# Patient Record
Sex: Female | Born: 1990 | Race: White | Hispanic: No | Smoking: Never smoker
Health system: Southern US, Community
[De-identification: ages and names within clinical notes are randomized; demographics above are authoritative.]

## PROBLEM LIST (undated history)

## (undated) HISTORY — PX: APPENDECTOMY: SHX54

---

## 2021-06-21 DIAGNOSIS — Z1388 Encounter for screening for disorder due to exposure to contaminants: Secondary | ICD-10-CM | POA: Diagnosis not present

## 2021-06-21 DIAGNOSIS — Z0389 Encounter for observation for other suspected diseases and conditions ruled out: Secondary | ICD-10-CM | POA: Diagnosis not present

## 2021-06-21 DIAGNOSIS — Z3009 Encounter for other general counseling and advice on contraception: Secondary | ICD-10-CM | POA: Diagnosis not present

## 2021-08-04 ENCOUNTER — Emergency Department (HOSPITAL_COMMUNITY)
Admission: EM | Admit: 2021-08-04 | Discharge: 2021-08-04 | Disposition: A | Discharge status: Home / Self Care | Payer: Medicaid - Out of State | Attending: Emergency Medicine | Admitting: Emergency Medicine

## 2021-08-04 ENCOUNTER — Emergency Department (HOSPITAL_COMMUNITY): Payer: Medicaid - Out of State

## 2021-08-04 ENCOUNTER — Encounter (HOSPITAL_COMMUNITY): Payer: Self-pay | Admitting: Emergency Medicine

## 2021-08-04 ENCOUNTER — Other Ambulatory Visit: Payer: Self-pay

## 2021-08-04 DIAGNOSIS — N9089 Other specified noninflammatory disorders of vulva and perineum: Secondary | ICD-10-CM

## 2021-08-04 DIAGNOSIS — M25561 Pain in right knee: Secondary | ICD-10-CM | POA: Diagnosis present

## 2021-08-04 DIAGNOSIS — N7689 Other specified inflammation of vagina and vulva: Secondary | ICD-10-CM | POA: Insufficient documentation

## 2021-08-04 DIAGNOSIS — G8929 Other chronic pain: Secondary | ICD-10-CM

## 2021-08-04 DIAGNOSIS — M25562 Pain in left knee: Secondary | ICD-10-CM | POA: Diagnosis not present

## 2021-08-04 NOTE — Discharge Instructions (Addendum)
Please follow-up with a gynecologist for second opinion of the vulvar anatomy.

## 2021-08-04 NOTE — ED Triage Notes (Signed)
Pt reports severe abdominal pain on left side and pelvic pain intermittently  4 months ago. Pt reports having swollen ovary in past. Pt also repots knee since April.

## 2021-08-04 NOTE — ED Provider Notes (Signed)
[facility]-EMERGENCY DEPT Provider Note   CSN: [id number] Arrival date & time: 08/04/21  1729     History Chief Complaint  Patient presents with   Abdominal Pain   Knee Pain   Pelvic Pain    Kylie Paul is a 30 y.o. female.  HPI    30 year old female comes in with chief complaint of knee pain and vulvar pain.  She did not mention any other discomfort to me.  Reports that she has been having some swelling to her left vulvar region for the last 4 months.  There is no itching or pain.  She has Medicaid that has very limited coverage.  She saw Medicaid nurses who did STD evaluation and put her on some antibiotics which did not help.  She denies any trauma, drainage.  She also reports having pain in her knees for the last 6 months.  Pain is present in both knees and typically provoked with her bending down and standing up.  History reviewed. No pertinent past medical history.  There are no problems to display for this patient.   OB History   No obstetric history on file.     History reviewed. No pertinent family history.     Home Medications Prior to Admission medications   Not on File    Allergies    Patient has no allergy information on record.  Review of Systems   Review of Systems  Constitutional:  Positive for activity change.  Genitourinary:  Negative for vaginal bleeding, vaginal discharge and vaginal pain.  Musculoskeletal:  Positive for arthralgias.  Allergic/Immunologic: Negative for immunocompromised state.   Physical Exam Updated Vital Signs BP 117/77   Pulse 63   Temp 98.2 F (36.8 C) (Oral)   Resp 19   LMP 06/14/2021 (Approximate)   SpO2 99%   Physical Exam Vitals and nursing note reviewed.  Constitutional:      Appearance: She is well-developed.  HENT:     Head: Atraumatic.  Cardiovascular:     Rate and Rhythm: Normal rate.  Pulmonary:     Effort: Pulmonary effort is normal.  Genitourinary:    Comments:  Left side of the vulva is more edematous compared to right, but there is no erythema, fluctuance, skin discoloration Musculoskeletal:     Cervical back: Normal range of motion and neck supple.  Skin:    General: Skin is warm and dry.  Neurological:     Mental Status: She is alert and oriented to person, place, and time.    ED Results / Procedures / Treatments   Labs (all labs ordered are listed, but only abnormal results are displayed) Labs Reviewed - No data to display  EKG None  Radiology DG Knee Complete 4 Views Left  Result Date: 08/04/2021 CLINICAL DATA:  Bilateral knee pain, no known injury, initial encounter EXAM: LEFT KNEE - COMPLETE 4+ VIEW COMPARISON:  None. FINDINGS: No evidence of fracture, dislocation, or joint effusion. No evidence of arthropathy or other focal bone abnormality. Soft tissues are unremarkable. IMPRESSION: No acute abnormality noted. Electronically Signed   By: Alcide Clever M.D.   On: 08/04/2021 19:10   DG Knee Complete 4 Views Right  Result Date: 08/04/2021 CLINICAL DATA:  Knee pain, no known injury, initial encounter EXAM: RIGHT KNEE - COMPLETE 4+ VIEW COMPARISON:  None. FINDINGS: No evidence of fracture, dislocation, or joint effusion. No evidence of arthropathy or other focal bone abnormality. Soft tissues are unremarkable. IMPRESSION: No acute abnormality noted. Electronically Signed  By: Alcide Clever M.D.   On: 08/04/2021 19:12    Procedures Procedures   Medications Ordered in ED Medications - No data to display  ED Course  I have reviewed the triage vital signs and the nursing notes.  Pertinent labs & imaging results that were available during my care of the patient were reviewed by me and considered in my medical decision making (see chart for details).    MDM Rules/Calculators/A&P                          30 year old comes in with chief complaint of volar swelling.  Although there is some asymmetry, does not appear to be abnormal on  exam.  No clear evidence of infection/abscess or even cyst.  The patient wants a second opinion, will give her gynecology follow-up information.  Bilateral knee pain that is only present with activity.  Possibly meniscal injury.  Recommended conservative management with RICE treatment.   Final Clinical Impression(s) / ED Diagnoses Final diagnoses:  Chronic pain of both knees  Edema of vulva    Rx / DC Orders ED Discharge Orders     None        Derwood Kaplan, MD 08/04/21 2059

## 2021-08-04 NOTE — ED Notes (Signed)
MD at bedside with chaperone for exam

## 2021-08-04 NOTE — ED Provider Notes (Signed)
Emergency Medicine Provider Triage Evaluation Note  Kylie Paul , a 30 y.o. female  was evaluated in triage.  Pt complains of left sided vaginal pain at the introitus with a swollen mass for the last 4 months. Denies drainage. Also endorses bilateral knee pain without inciting incident. Family hx of arthritis. Denies numbness, tingling. Was seen for vaginal mass and prescribed antibiotics without intervention or improvement.  Review of Systems  Positive: Vaginal mass, knee pain Negative: Numbness, tingling, dysuria, hematuria  Physical Exam  BP 128/76   Pulse 82   Temp 98.2 F (36.8 C) (Oral)   Resp 19   SpO2 98%  Gen:   Awake, no distress   Resp:  Normal effort  MSK:   Moves extremities without difficulty  Other:  Intact strength 5/5 bil LE, vaginal exam deferred until patient out of triage  Medical Decision Making  Medically screening exam initiated at 6:10 PM.  Appropriate orders placed.  Kylie Paul was informed that the remainder of the evaluation will be completed by another provider, this initial triage assessment does not replace that evaluation, and the importance of remaining in the ED until their evaluation is complete.  Vaginal mass, knee pain   Kylie Floss, PA-C 08/04/21 1812    Kylie Kaplan, MD 08/05/21 1410

## 2021-08-04 NOTE — ED Notes (Signed)
Pt NAD, a/ox4, c/o bilateral knee pain x months and also that she thinks her clitoris looks different. Denies dysuria, recent sexual intercourse, ABD pain, n/v/d.

## 2021-08-04 NOTE — ED Notes (Signed)
Pt NAD, a/ox4. Pt verbalizes understanding of all DC and f/u instructions. All questions answered. Pt walks with steady gait to lobby at DC.  ? ?

## 2021-08-04 NOTE — ED Notes (Signed)
Patient transported to X-ray 

## 2022-07-07 ENCOUNTER — Encounter (HOSPITAL_COMMUNITY): Payer: Self-pay

## 2022-07-07 ENCOUNTER — Emergency Department (HOSPITAL_COMMUNITY)
Admission: EM | Admit: 2022-07-07 | Discharge: 2022-07-07 | Disposition: A | Discharge status: Home / Self Care | Payer: Medicaid - Out of State | Attending: Emergency Medicine | Admitting: Emergency Medicine

## 2022-07-07 DIAGNOSIS — R42 Dizziness and giddiness: Secondary | ICD-10-CM | POA: Insufficient documentation

## 2022-07-07 LAB — I-STAT BETA HCG BLOOD, ED (MC, WL, AP ONLY): I-stat hCG, quantitative: <5 m[IU]/mL (ref <5)

## 2022-07-07 LAB — BASIC METABOLIC PANEL
Anion gap: 6 (ref 5–15)
BUN: 10 mg/dL (ref 6–20)
CO2: 27 mmol/L (ref 22–32)
Calcium: 8.9 mg/dL (ref 8.9–10.3)
Chloride: 107 mmol/L (ref 98–111)
Creatinine, Ser: 0.68 mg/dL (ref 0.44–1.00)
GFR, Estimated: >60 mL/min (ref >60)
Glucose, Bld: 88 mg/dL (ref 70–99)
Potassium: 3.5 mmol/L (ref 3.5–5.1)
Sodium: 140 mmol/L (ref 135–145)

## 2022-07-07 LAB — CBC
HCT: 38.5 % (ref 36.0–46.0)
Hemoglobin: 12.8 g/dL (ref 12.0–15.0)
MCH: 32.2 pg (ref 26.0–34.0)
MCHC: 33.2 g/dL (ref 30.0–36.0)
MCV: 96.7 fL (ref 80.0–100.0)
Platelets: 199 10*3/uL (ref 150–400)
RBC: 3.98 MIL/uL (ref 3.87–5.11)
RDW: 11.6 % (ref 11.5–15.5)
WBC: 6.5 10*3/uL (ref 4.0–10.5)
nRBC: 0 % (ref 0.0–0.2)

## 2022-07-07 LAB — URINALYSIS, ROUTINE W REFLEX MICROSCOPIC
Bilirubin Urine: NEGATIVE
Glucose, UA: NEGATIVE mg/dL
Hgb urine dipstick: NEGATIVE
Ketones, ur: NEGATIVE mg/dL
Leukocytes,Ua: NEGATIVE
Nitrite: NEGATIVE
Protein, ur: NEGATIVE mg/dL
Specific Gravity, Urine: 1.008 (ref 1.005–1.030)
pH: 6 (ref 5.0–8.0)

## 2022-07-07 LAB — CBG MONITORING, ED: Glucose-Capillary: 79 mg/dL (ref 70–99)

## 2022-07-07 MED ORDER — MECLIZINE HCL 25 MG PO TABS: 25.0000 mg | ORAL_TABLET | Freq: Three times a day (TID) | ORAL | 0 refills | Status: AC | PRN

## 2022-07-07 MED ORDER — MECLIZINE HCL 25 MG PO TABS
25.0000 mg | ORAL_TABLET | Freq: Once | ORAL | Status: AC
  Administered 2022-07-07: 25 mg via ORAL
  Filled 2022-07-07: qty 1

## 2022-07-07 NOTE — ED Triage Notes (Signed)
Pt arrived via POV, c/o headache and dizziness. States headache has subsided, but dizziness continues.

## 2022-07-07 NOTE — ED Provider Notes (Signed)
[facility] DEPT Provider Note   CSN: [id number] Arrival date & time: 07/07/22  1514     History  Chief Complaint  Patient presents with   Dizziness    Kylie Paul is a 31 y.o. female.  The history is provided by the patient. No language interpreter was used.  Dizziness Quality:  Vertigo Severity:  Moderate Onset quality:  Gradual Duration:  4 days Timing:  Constant Progression:  Unchanged Chronicity:  New Relieved by:  Nothing Worsened by:  Nothing Ineffective treatments:  None tried Associated symptoms: no nausea, no tinnitus, no vision changes and no vomiting   Risk factors: no anemia and no hx of vertigo        Home Medications Prior to Admission medications   Medication Sig Start Date End Date Taking? Authorizing Provider  meclizine (ANTIVERT) 25 MG tablet Take 1 tablet (25 mg total) by mouth 3 (three) times daily as needed for dizziness. 07/07/22  Yes Fransico Meadow, PA-C      Allergies    Patient has no known allergies.    Review of Systems   Review of Systems  HENT:  Negative for tinnitus.   Gastrointestinal:  Negative for nausea and vomiting.  Neurological:  Positive for dizziness.  All other systems reviewed and are negative.   Physical Exam Updated Vital Signs BP 135/77 (BP Location: Right Arm)   Pulse 74   Temp 98.7 F (37.1 C) (Oral)   Resp 18   Ht 5\' 5"  (1.651 m)   Wt 65.8 kg   SpO2 98%   BMI 24.13 kg/m  Physical Exam Vitals and nursing note reviewed.  Constitutional:      Appearance: She is well-developed.  HENT:     Head: Normocephalic.     Right Ear: Tympanic membrane normal.     Left Ear: Tympanic membrane normal.     Nose: Nose normal.     Mouth/Throat:     Mouth: Mucous membranes are moist.  Eyes:     Extraocular Movements: Extraocular movements intact.     Conjunctiva/sclera: Conjunctivae normal.     Pupils: Pupils are equal, round, and reactive to light.  Cardiovascular:     Rate and  Rhythm: Normal rate.  Pulmonary:     Effort: Pulmonary effort is normal.  Abdominal:     General: There is no distension.  Musculoskeletal:        General: Normal range of motion.     Cervical back: Normal range of motion.  Neurological:     Mental Status: She is alert and oriented to person, place, and time.  Psychiatric:        Mood and Affect: Mood normal.     ED Results / Procedures / Treatments   Labs (all labs ordered are listed, but only abnormal results are displayed) Labs Reviewed  URINALYSIS, ROUTINE W REFLEX MICROSCOPIC - Abnormal; Notable for the following components:      Result Value   Color, Urine STRAW (*)    All other components within normal limits  BASIC METABOLIC PANEL  CBC  CBG MONITORING, ED  I-STAT BETA HCG BLOOD, ED ([facility], [facility], [facility] ONLY)    EKG EKG Interpretation  Date/Time:  Sunday July 07 2022 16:14:40 EST Ventricular Rate:  53 PR Interval:  144 QRS Duration: 110 QT Interval:  421 QTC Calculation: 396 R Axis:   77 Text Interpretation: Sinus arrhythmia RSR' in V1 or V2, right VCD or RVH Baseline wander in lead(s) V2 No old  tracing to compare Confirmed by Linwood Dibbles [phone]) on 07/07/2022 4:21:54 PM  Radiology No results found.  Procedures Procedures    Medications Ordered in ED Medications  meclizine (ANTIVERT) tablet 25 mg (25 mg Oral Given 07/07/22 1617)    ED Course/ Medical Decision Making/ A&P                           Medical Decision Making Pt reports she has been dizzy since Thursday.  Pt reports she had a headache that resolved with medication but has contiued to have dizziness.  Amount and/or Complexity of Data Reviewed Independent Historian: parent Labs: ordered. Decision-making details documented in ED Course.    Details: Labs ordered reviewed and interpreted  ua is normal  no anemia.  ECG/medicine tests: ordered and independent interpretation performed.  Risk Prescription drug management. Risk Details: Pt has a  reassuring exam.  No neurologic findings, EKG and labs reviewed.  Pt given rx for antivert.  Pt advised primary care follow up.           Final Clinical Impression(s) / ED Diagnoses Final diagnoses:  Vertigo    Rx / DC Orders ED Discharge Orders          Ordered    meclizine (ANTIVERT) 25 MG tablet  3 times daily PRN        07/07/22 2026           An After Visit Summary was printed and given to the patient.    Osie Cheeks 07/07/22 2033    Wynetta Fines, MD 07/08/22 [phone]

## 2022-07-07 NOTE — ED Provider Triage Note (Signed)
Emergency Medicine Provider Triage Evaluation Note  Kylie Paul , a 31 y.o. female  was evaluated in triage.  Pt complains of dizzy. Report persistent dizziness x 3 days.  Mild nausea.  No fever, headache, cold sxs, cp, dysuria  Review of Systems  Positive: As above Negative: As above  Physical Exam  BP 135/77 (BP Location: Right Arm)   Pulse 74   Temp 98.7 F (37.1 C) (Oral)   Resp 18   Ht 5\' 5"  (1.651 m)   Wt 65.8 kg   SpO2 98%   BMI 24.13 kg/m  Gen:   Awake, no distress   Resp:  Normal effort  MSK:   Moves extremities without difficulty  Other:    Medical Decision Making  Medically screening exam initiated at 3:34 PM.  Appropriate orders placed.  Kylie Paul was informed that the remainder of the evaluation will be completed by another provider, this initial triage assessment does not replace that evaluation, and the importance of remaining in the ED until their evaluation is complete.     Domenic Moras, PA-C 07/07/22 1541

## 2022-07-07 NOTE — Discharge Instructions (Addendum)
Return if any problems.  Follow up with primary care for recheck.

## 2022-10-14 ENCOUNTER — Ambulatory Visit
Admission: EM | Admit: 2022-10-14 | Discharge: 2022-10-14 | Disposition: A | Discharge status: Home / Self Care | Payer: Medicaid Other | Attending: Urgent Care | Admitting: Urgent Care

## 2022-10-14 DIAGNOSIS — R110 Nausea: Secondary | ICD-10-CM

## 2022-10-14 MED ORDER — ONDANSETRON 8 MG PO TBDP: 8.0000 mg | ORAL_TABLET | Freq: Three times a day (TID) | ORAL | 0 refills | Status: AC | PRN

## 2022-10-14 NOTE — ED Triage Notes (Addendum)
Pt states she needs RTW-states she had an increase in her baseline nausea 2 days ago-states she feels better and was told by work she needed a note-NAD-steady gait

## 2022-10-14 NOTE — ED Provider Notes (Signed)
[facility]  Note:  This document was prepared using Systems analyst and may include unintentional dictation errors.  MRN: [id number] DOB: 1990/11/15  Subjective:   Kylie Paul is a 32 y.o. female presenting for ~3 year history of acute on chronic nausea without vomiting.  Patient states that she had to miss work this past weekend and is required to present a note to return to work.  No fever, throat pain, painful swallowing, abdominal pain, bloody stools, constipation, diarrhea. No fever, recent antibiotic use, hospitalizations or long distance travel.  Has not eaten raw foods, drank unfiltered water.  No history of GI disorders including Crohn's, IBS, ulcerative colitis.  Vapes multiple times daily.  Marijuana use.  Has never had an patient with a gastroenterologist.  No current facility-administered medications for this encounter.  Current Outpatient Medications:    meclizine (ANTIVERT) 25 MG tablet, Take 1 tablet (25 mg total) by mouth 3 (three) times daily as needed for dizziness., Disp: 30 tablet, Rfl: 0   No Known Allergies  History reviewed. No pertinent past medical history.   Past Surgical History:  Procedure Laterality Date   APPENDECTOMY      No family history on file.  Social History   Tobacco Use   Smoking status: Never   Smokeless tobacco: Never  Vaping Use   Vaping Use: Some days  Substance Use Topics   Alcohol use: Not Currently   Drug use: Never    ROS   Objective:   Vitals: BP 112/68 (BP Location: Right Arm)   Pulse 78   Temp 98.8 F (37.1 C) (Oral)   Resp 18   LMP 09/24/2022   SpO2 98%   Physical Exam Constitutional:      General: She is not in acute distress.    Appearance: Normal appearance. She is well-developed. She is not ill-appearing, toxic-appearing or diaphoretic.  HENT:     Head: Normocephalic and atraumatic.     Nose: Nose normal.     Mouth/Throat:     Mouth: Mucous membranes are  moist.  Eyes:     General: No scleral icterus.       Right eye: No discharge.        Left eye: No discharge.     Extraocular Movements: Extraocular movements intact.     Conjunctiva/sclera: Conjunctivae normal.  Cardiovascular:     Rate and Rhythm: Normal rate.  Pulmonary:     Effort: Pulmonary effort is normal.  Skin:    General: Skin is warm and dry.  Neurological:     General: No focal deficit present.     Mental Status: She is alert and oriented to person, place, and time.  Psychiatric:        Mood and Affect: Mood normal.        Behavior: Behavior normal.        Thought Content: Thought content normal.        Judgment: Judgment normal.     Assessment and Plan :   PDMP not reviewed this encounter.  1. Nausea without vomiting     Patient was not inclined to pursue further workup as she feels hesitant about having any kind of anesthesia for procedures such as endoscopy or just in general.  She would like medication to help her manage her nausea.  No concern for pregnancy.  Her primary goal was to obtain a note to return to work.  This is appropriate as I have low suspicion for  an infectious process given the chronic nature of her symptoms.  Use Zofran as needed.  Counseled patient on potential for adverse effects with medications prescribed/recommended today, ER and return-to-clinic precautions discussed, patient verbalized understanding.    Jaynee Eagles, PA-C 10/14/22 1426

## 2022-10-14 NOTE — Discharge Instructions (Addendum)
You may use ondansetron as needed for persistent nausea and/or vomiting. I am providing you with information to a gastroenterology practice that may be able to do more testing to evaluate your persistent nausea. Feel free to set up a consultation at your convenience.

## 2022-11-10 ENCOUNTER — Ambulatory Visit
Admission: EM | Admit: 2022-11-10 | Discharge: 2022-11-10 | Disposition: A | Discharge status: Home / Self Care | Payer: Medicaid Other | Attending: Nurse Practitioner | Admitting: Nurse Practitioner

## 2022-11-10 DIAGNOSIS — N760 Acute vaginitis: Secondary | ICD-10-CM | POA: Insufficient documentation

## 2022-11-10 MED ORDER — FLUCONAZOLE 150 MG PO TABS: 150.0000 mg | ORAL_TABLET | Freq: Every day | ORAL | 0 refills | Status: DC

## 2022-11-10 MED ORDER — METRONIDAZOLE 500 MG PO TABS: 500.0000 mg | ORAL_TABLET | Freq: Two times a day (BID) | ORAL | 0 refills | Status: DC

## 2022-11-10 NOTE — Discharge Instructions (Signed)
Diflucan as prescribed Flagyl twice daily for 7 days The clinic will contact you with results of the vaginal swab done today if positive Follow-up with your PCP or GYN if symptoms do not improve Please go to the ER for any worsening symptoms

## 2022-11-10 NOTE — ED Triage Notes (Signed)
Pt c/o vaginal itching, welling and redness that began friday.  Home interventions: none

## 2022-11-10 NOTE — ED Provider Notes (Signed)
[facility]    CSN: [id number] Arrival date & time: 11/10/22  1309      History   Chief Complaint Chief Complaint  Patient presents with   Vaginal Itching    HPI Kylie Paul is a 32 y.o. female resents for evaluation of vaginal irritation.  Patient reports 2 to 3 days of vaginal itching, irritation, swelling.  Reports some vaginal discharge but is not sure if it is more than normal.  Denies dysuria, fevers, nausea/vomiting, flank pain.  No STD exposure or concern.  Reports a history of BV and yeast and states this feels similar to both.  States over-the-counter yeast medication does not work for her.  No OTC meds have been used for symptoms.  No other concerns at this time.   Vaginal Itching    History reviewed. No pertinent past medical history.  There are no problems to display for this patient.   Past Surgical History:  Procedure Laterality Date   APPENDECTOMY      OB History   No obstetric history on file.      Home Medications    Prior to Admission medications   Medication Sig Start Date End Date Taking? Authorizing Provider  fluconazole (DIFLUCAN) 150 MG tablet Take 1 tablet (150 mg total) by mouth daily. Take 1 tablet by mouth once and may repeat in 3 days if symptoms persist 11/10/22  Yes Melynda Ripple, NP  metroNIDAZOLE (FLAGYL) 500 MG tablet Take 1 tablet (500 mg total) by mouth 2 (two) times daily. 11/10/22  Yes Melynda Ripple, NP  meclizine (ANTIVERT) 25 MG tablet Take 1 tablet (25 mg total) by mouth 3 (three) times daily as needed for dizziness. 07/07/22   Fransico Meadow, PA-C  ondansetron (ZOFRAN-ODT) 8 MG disintegrating tablet Take 1 tablet (8 mg total) by mouth every 8 (eight) hours as needed for nausea or vomiting. 10/14/22   Jaynee Eagles, PA-C    Family History History reviewed. No pertinent family history.  Social History Social History   Tobacco Use   Smoking status: Never   Smokeless tobacco: Never  Vaping Use   Vaping Use: Some  days  Substance Use Topics   Alcohol use: Not Currently   Drug use: Never     Allergies   Patient has no known allergies.   Review of Systems Review of Systems  Genitourinary:  Positive for vaginal discharge.     Physical Exam Triage Vital Signs ED Triage Vitals  Enc Vitals Group     BP 11/10/22 1349 111/60     Pulse Rate 11/10/22 1349 72     Resp 11/10/22 1349 16     Temp 11/10/22 1349 98 F (36.7 C)     Temp Source 11/10/22 1349 Oral     SpO2 11/10/22 1349 98 %     Weight --      Height --      Head Circumference --      Peak Flow --      Pain Score 11/10/22 1348 0     Pain Loc --      Pain Edu? --      Excl. in [place]? --    No data found.  Updated Vital Signs BP 111/60 (BP Location: Left Arm)   Pulse 72   Temp 98 F (36.7 C) (Oral)   Resp 16   LMP 10/31/2022 (Approximate)   SpO2 98%   Visual Acuity Right Eye Distance:   Left Eye Distance:  Bilateral Distance:    Right Eye Near:   Left Eye Near:    Bilateral Near:     Physical Exam Vitals and nursing note reviewed.  Constitutional:      Appearance: Normal appearance.  HENT:     Head: Normocephalic and atraumatic.  Eyes:     Pupils: Pupils are equal, round, and reactive to light.  Cardiovascular:     Rate and Rhythm: Normal rate.  Pulmonary:     Effort: Pulmonary effort is normal.  Abdominal:     Tenderness: There is no right CVA tenderness or left CVA tenderness.  Skin:    General: Skin is warm and dry.  Neurological:     General: No focal deficit present.     Mental Status: She is alert and oriented to person, place, and time.  Psychiatric:        Mood and Affect: Mood normal.        Behavior: Behavior normal.      UC Treatments / Results  Labs (all labs ordered are listed, but only abnormal results are displayed) Labs Reviewed  CERVICOVAGINAL ANCILLARY ONLY    EKG   Radiology No results found.  Procedures Procedures (including critical care time)  Medications Ordered  in UC Medications - No data to display  Initial Impression / Assessment and Plan / UC Course  I have reviewed the triage vital signs and the nursing notes.  Pertinent labs & imaging results that were available during my care of the patient were reviewed by me and considered in my medical decision making (see chart for details).     Start Diflucan and Flagyl as prescribed Will contact patient results of vaginal swab if positive Follow-up with PCP or GYN if symptoms do not improve ER precautions reviewed and patient verbalized understanding Final Clinical Impressions(s) / UC Diagnoses   Final diagnoses:  Acute vaginitis     Discharge Instructions      Diflucan as prescribed Flagyl twice daily for 7 days The clinic will contact you with results of the vaginal swab done today if positive Follow-up with your PCP or GYN if symptoms do not improve Please go to the ER for any worsening symptoms   ED Prescriptions     Medication Sig Dispense Auth. Provider   metroNIDAZOLE (FLAGYL) 500 MG tablet Take 1 tablet (500 mg total) by mouth 2 (two) times daily. 14 tablet Melynda Ripple, NP   fluconazole (DIFLUCAN) 150 MG tablet Take 1 tablet (150 mg total) by mouth daily. Take 1 tablet by mouth once and may repeat in 3 days if symptoms persist 2 tablet Melynda Ripple, NP      PDMP not reviewed this encounter.   Melynda Ripple, NP 11/10/22 [phone]

## 2022-11-11 LAB — CERVICOVAGINAL ANCILLARY ONLY
Bacterial Vaginitis (gardnerella): NEGATIVE
Candida Glabrata: NEGATIVE
Candida Vaginitis: POSITIVE — AB
Comment: NEGATIVE
Comment: NEGATIVE
Comment: NEGATIVE

## 2022-11-17 ENCOUNTER — Ambulatory Visit
Admission: EM | Admit: 2022-11-17 | Discharge: 2022-11-17 | Disposition: A | Discharge status: Home / Self Care | Payer: Medicaid Other | Attending: Urgent Care | Admitting: Urgent Care

## 2022-11-17 DIAGNOSIS — N760 Acute vaginitis: Secondary | ICD-10-CM

## 2022-11-17 MED ORDER — FLUCONAZOLE 150 MG PO TABS: 150.0000 mg | ORAL_TABLET | ORAL | 0 refills | Status: DC

## 2022-11-17 NOTE — ED Triage Notes (Signed)
Pt c/o vaginal itching and irritation x 1 week-states she took abx last week-requesting yeast med and RTW note-NAD-steady gait

## 2022-11-17 NOTE — Discharge Instructions (Addendum)
Dosing of fluconazole.  11/17/2022 Sunday 11/20/2022 Wednesday 11/23/2022 Saturday 11/26/2022 Tuesday 11/29/2022 Friday

## 2022-11-17 NOTE — ED Provider Notes (Signed)
[facility]  Note:  This document was prepared using Systems analyst and may include unintentional dictation errors.  MRN: [id number] DOB: [birth year]  Subjective:   Kylie Paul is a 32 y.o. female presenting for recheck on persistent and worsening vaginal itching.  Patient was seen in 11/10/2022 and started empirically on metronidazole and fluconazole.  She tested negative for everything except for yeast vaginitis.  Has had difficulty with yeast infections in the past.  Requires multiple doses of fluconazole.  She did end up stopping the metronidazole after few days due to being unable to tolerate it.  No concerns for STI, does not want to be swabbed again.  No urinary symptoms.  No current facility-administered medications for this encounter.  Current Outpatient Medications:    fluconazole (DIFLUCAN) 150 MG tablet, Take 1 tablet (150 mg total) by mouth daily. Take 1 tablet by mouth once and may repeat in 3 days if symptoms persist, Disp: 2 tablet, Rfl: 0   meclizine (ANTIVERT) 25 MG tablet, Take 1 tablet (25 mg total) by mouth 3 (three) times daily as needed for dizziness., Disp: 30 tablet, Rfl: 0   metroNIDAZOLE (FLAGYL) 500 MG tablet, Take 1 tablet (500 mg total) by mouth 2 (two) times daily., Disp: 14 tablet, Rfl: 0   ondansetron (ZOFRAN-ODT) 8 MG disintegrating tablet, Take 1 tablet (8 mg total) by mouth every 8 (eight) hours as needed for nausea or vomiting., Disp: 20 tablet, Rfl: 0   No Known Allergies  History reviewed. No pertinent past medical history.   Past Surgical History:  Procedure Laterality Date   APPENDECTOMY      No family history on file.  Social History   Tobacco Use   Smoking status: Never   Smokeless tobacco: Never  Vaping Use   Vaping Use: Some days  Substance Use Topics   Alcohol use: Not Currently   Drug use: Never    ROS   Objective:   Vitals: BP (!) 116/58 (BP Location: Left Arm)   Pulse 74    Temp 98.6 F (37 C) (Oral)   Resp 16   LMP 10/31/2022 (Approximate)   SpO2 98%   Physical Exam Constitutional:      General: She is not in acute distress.    Appearance: Normal appearance. She is well-developed. She is not ill-appearing, toxic-appearing or diaphoretic.  HENT:     Head: Normocephalic and atraumatic.     Nose: Nose normal.     Mouth/Throat:     Mouth: Mucous membranes are moist.  Eyes:     General: No scleral icterus.       Right eye: No discharge.        Left eye: No discharge.     Extraocular Movements: Extraocular movements intact.  Cardiovascular:     Rate and Rhythm: Normal rate.  Pulmonary:     Effort: Pulmonary effort is normal.  Genitourinary:    Comments: Patient politely declines pelvic exam. Skin:    General: Skin is warm and dry.  Neurological:     General: No focal deficit present.     Mental Status: She is alert and oriented to person, place, and time.  Psychiatric:        Mood and Affect: Mood normal.        Behavior: Behavior normal.        Thought Content: Thought content normal.        Judgment: Judgment normal.     Assessment and Plan :  PDMP not reviewed this encounter.  1. Acute vaginitis     Will use an extended course of fluconazole given extensive history of yeast vaginitis.  Patient declined further testing and I am in agreement. Counseled patient on potential for adverse effects with medications prescribed/recommended today, ER and return-to-clinic precautions discussed, patient verbalized understanding.    Jaynee Eagles, PA-C 11/17/22 1100

## 2022-12-11 IMAGING — CR DG KNEE COMPLETE 4+V*R*
4 series · 4 of 4 positions shown · non-contrast
Comparison: None.

CLINICAL DATA: Knee pain, no known injury, initial encounter

EXAM:
RIGHT KNEE - COMPLETE 4+ VIEW

[t knee ap right]
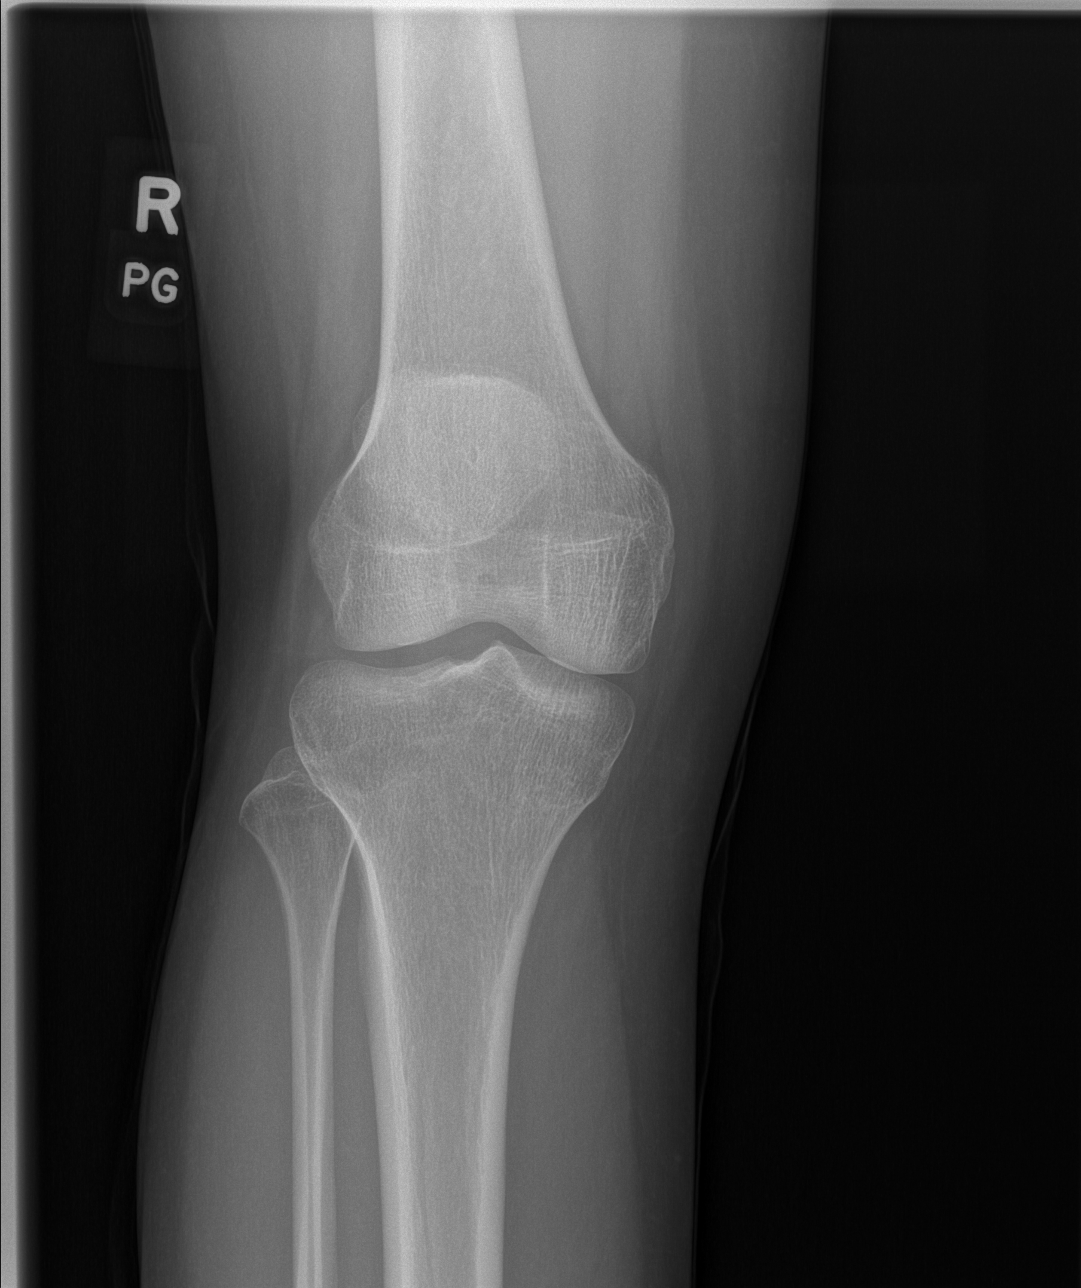

[t knee obl right (1 of 2)]
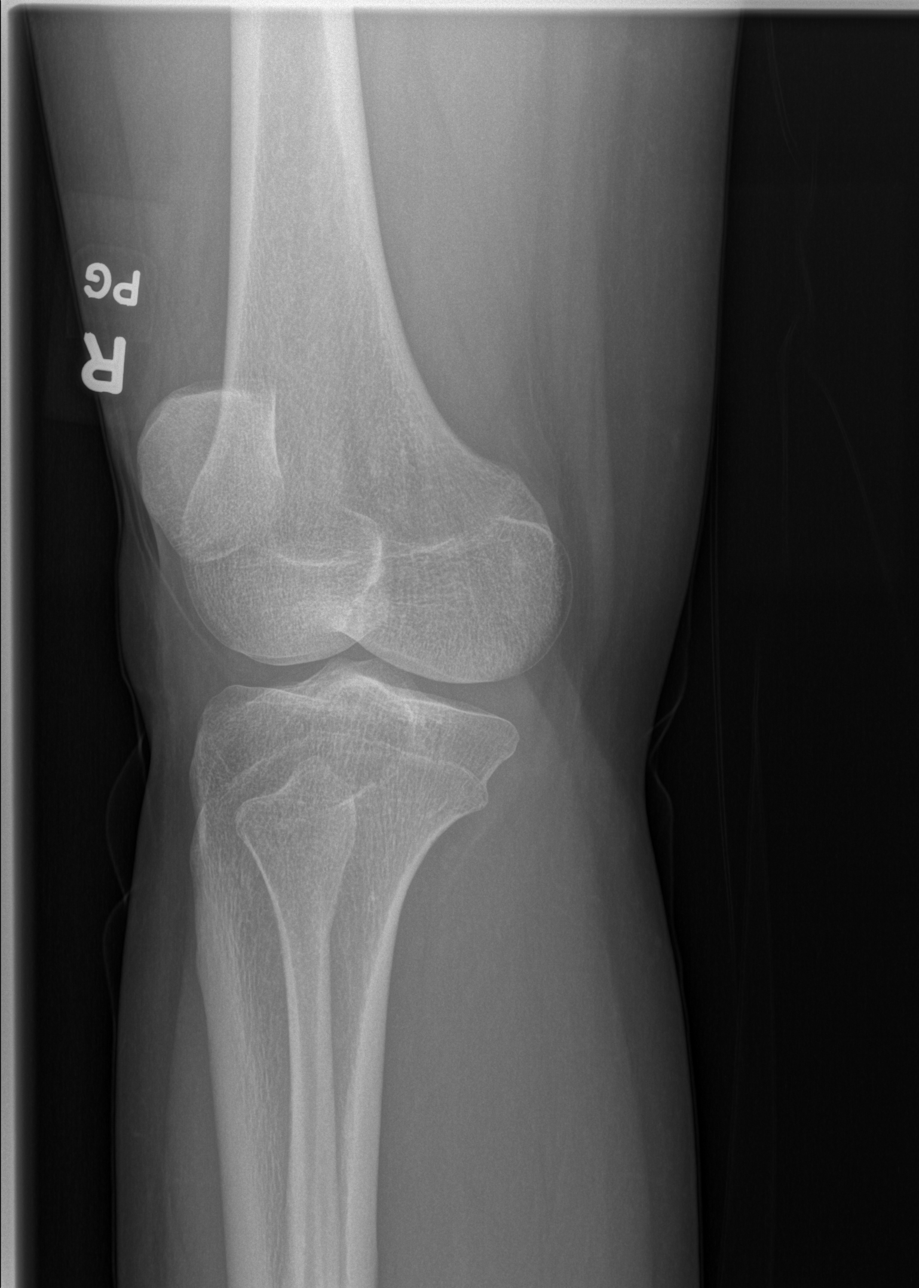

[t knee obl right (2 of 2)]
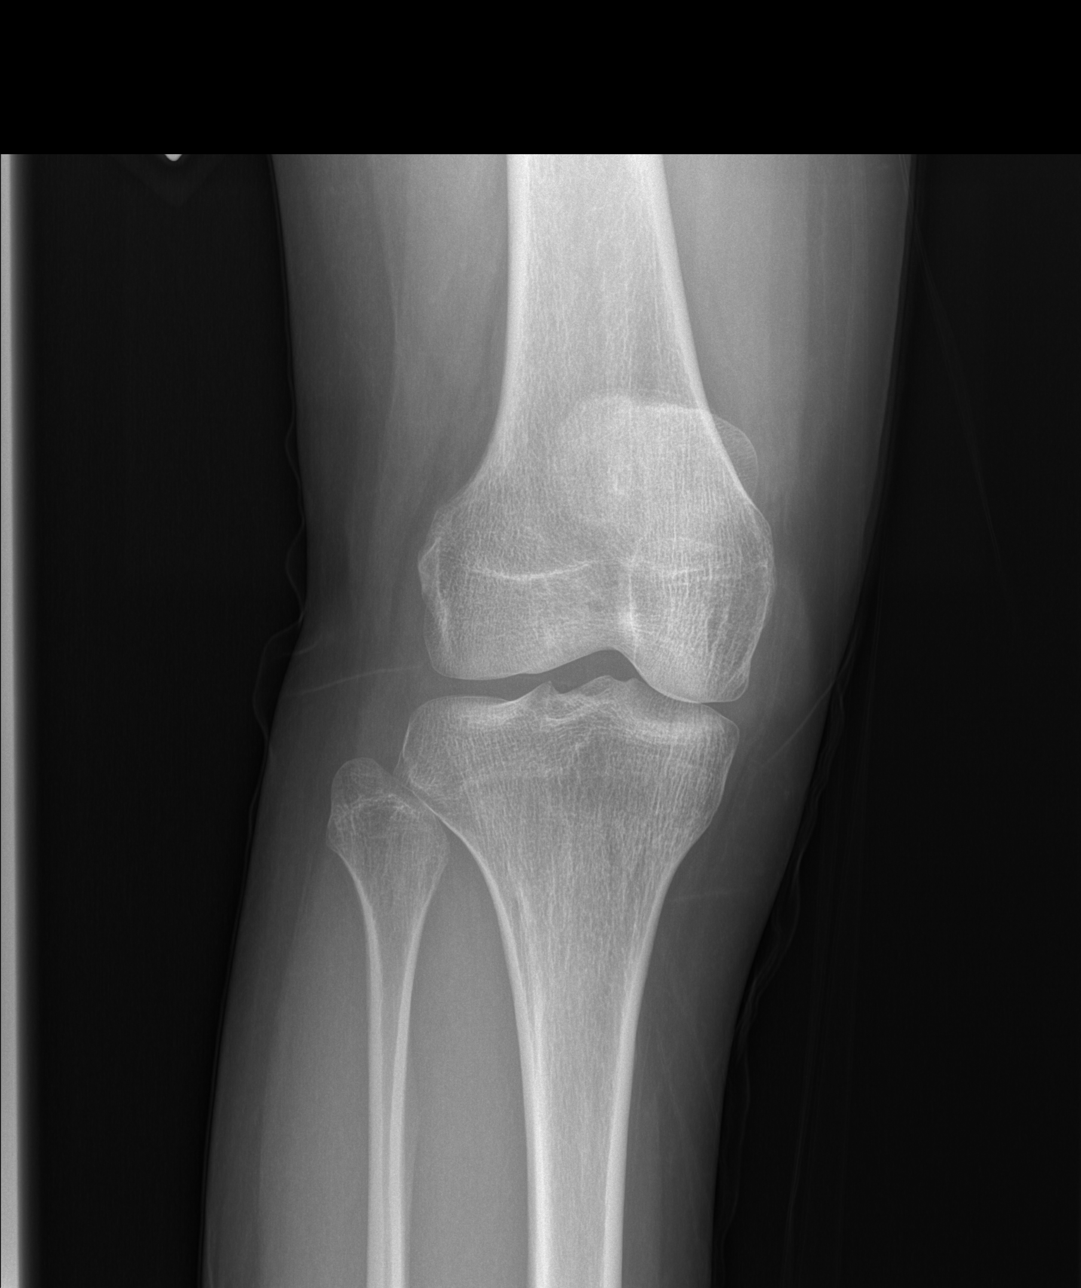

[t knee lat right]
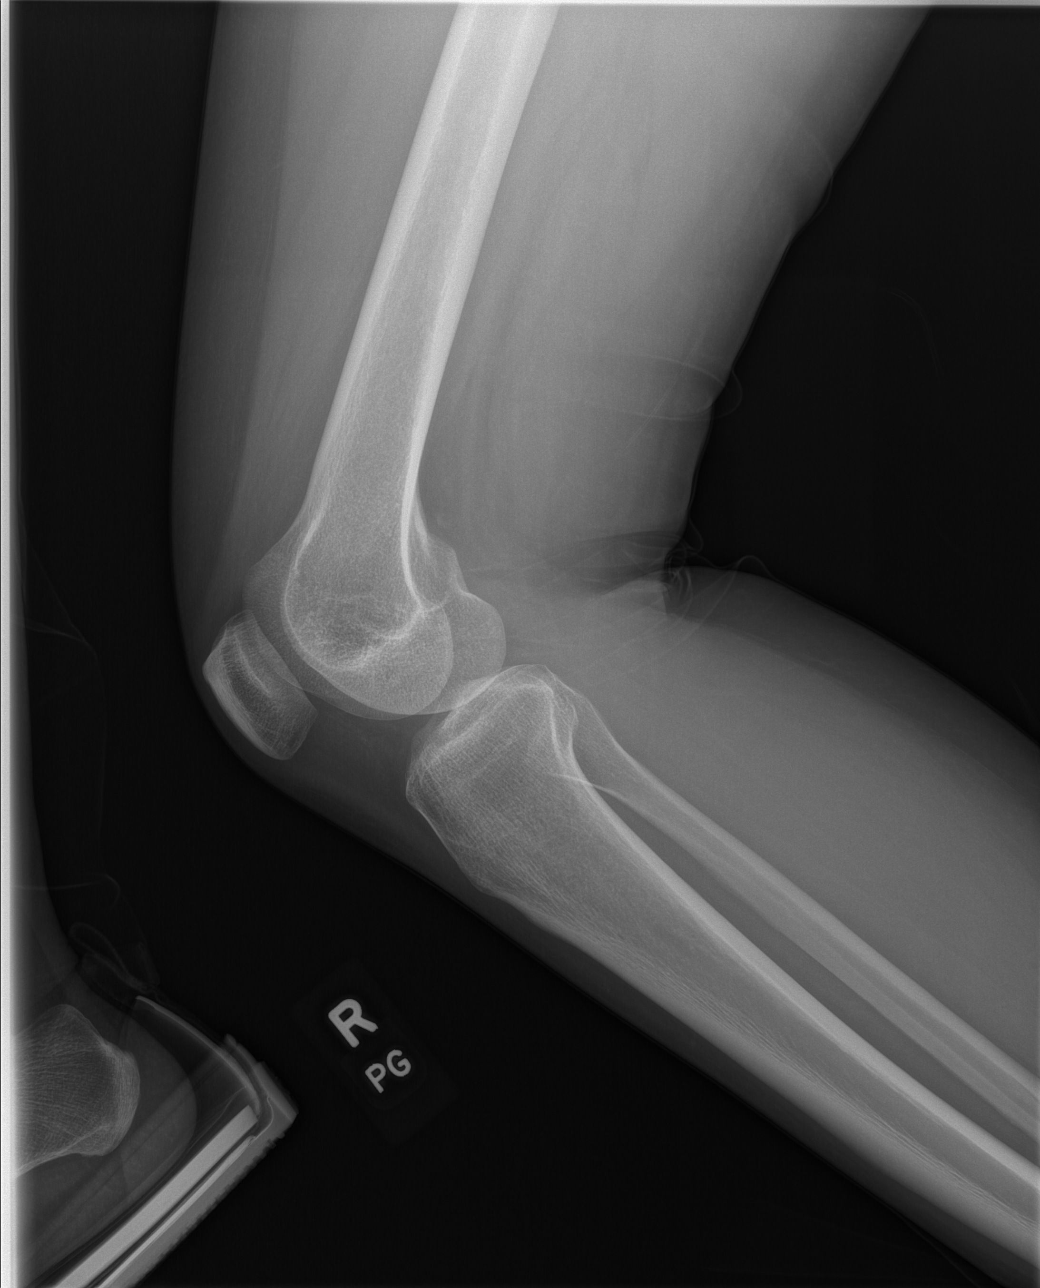

[4 of 4 positions shown; findings below may reference images not displayed]

FINDINGS: No evidence of fracture, dislocation, or joint effusion. No evidence
of arthropathy or other focal bone abnormality. Soft tissues are
unremarkable.
IMPRESSION: No acute abnormality noted.

## 2023-01-07 ENCOUNTER — Other Ambulatory Visit: Payer: Self-pay

## 2023-01-22 ENCOUNTER — Ambulatory Visit
Admission: EM | Admit: 2023-01-22 | Discharge: 2023-01-22 | Disposition: A | Discharge status: Home / Self Care | Payer: Medicaid Other | Attending: Nurse Practitioner | Admitting: Nurse Practitioner

## 2023-01-22 DIAGNOSIS — M62838 Other muscle spasm: Secondary | ICD-10-CM | POA: Diagnosis not present

## 2023-01-22 MED ORDER — NAPROXEN 500 MG PO TABS: 500.0000 mg | ORAL_TABLET | Freq: Two times a day (BID) | ORAL | 0 refills | Status: AC

## 2023-01-22 MED ORDER — KETOROLAC TROMETHAMINE 30 MG/ML IJ SOLN
30.0000 mg | Freq: Once | INTRAMUSCULAR | Status: AC
  Administered 2023-01-22: 30 mg via INTRAMUSCULAR

## 2023-01-22 MED ORDER — CYCLOBENZAPRINE HCL 10 MG PO TABS: 10.0000 mg | ORAL_TABLET | Freq: Two times a day (BID) | ORAL | 0 refills | Status: AC | PRN

## 2023-01-22 NOTE — ED Provider Notes (Signed)
[facility]    CSN: [id number] Arrival date & time: 01/22/23  1354      History   Chief Complaint Chief Complaint  Patient presents with   Neck Pain    HPI Kylie Paul is a 32 y.o. female that is for evaluation of neck pain.  Patient reports 2 weeks ago she developed a right-sided neck pain/muscle spasm that has been persistent and worsening since that time.  Denies any known injury or inciting event.  Reports the pain is primarily with movement and worse when she tries to lay down.  She denies any numbness/tingling/weakness of her upper extremities.  Denies any known injury.  Denies any fevers, photosensitivity, nuchal rigidity.  She has been taking Excedrin with minimal relief.  She has not taken any medications today for her symptoms.  No history of neck injuries or surgeries in the past.  No other concerns at this time.   Neck Pain   History reviewed. No pertinent past medical history.  There are no problems to display for this patient.   Past Surgical History:  Procedure Laterality Date   APPENDECTOMY      OB History   No obstetric history on file.      Home Medications    Prior to Admission medications   Medication Sig Start Date End Date Taking? Authorizing Provider  cyclobenzaprine (FLEXERIL) 10 MG tablet Take 1 tablet (10 mg total) by mouth 2 (two) times daily as needed for muscle spasms. 01/22/23  Yes Radford Pax, NP  naproxen (NAPROSYN) 500 MG tablet Take 1 tablet (500 mg total) by mouth 2 (two) times daily for 7 days. 01/22/23 01/29/23 Yes Radford Pax, NP  fluconazole (DIFLUCAN) 150 MG tablet Take 1 tablet (150 mg total) by mouth every 3 (three) days. 11/17/22   Wallis Bamberg, PA-C  meclizine (ANTIVERT) 25 MG tablet Take 1 tablet (25 mg total) by mouth 3 (three) times daily as needed for dizziness. 07/07/22   Elson Areas, PA-C  metroNIDAZOLE (FLAGYL) 500 MG tablet Take 1 tablet (500 mg total) by mouth 2 (two) times daily. 11/10/22   Radford Pax, NP  ondansetron (ZOFRAN-ODT) 8 MG disintegrating tablet Take 1 tablet (8 mg total) by mouth every 8 (eight) hours as needed for nausea or vomiting. 10/14/22   Wallis Bamberg, PA-C    Family History History reviewed. No pertinent family history.  Social History Social History   Tobacco Use   Smoking status: Never   Smokeless tobacco: Never  Vaping Use   Vaping Use: Some days  Substance Use Topics   Alcohol use: Not Currently   Drug use: Never     Allergies   Patient has no known allergies.   Review of Systems Review of Systems  Musculoskeletal:  Positive for neck pain.     Physical Exam Triage Vital Signs ED Triage Vitals  Enc Vitals Group     BP 01/22/23 1422 127/77     Pulse Rate 01/22/23 1422 96     Resp 01/22/23 1422 17     Temp 01/22/23 1422 99 F (37.2 C)     Temp Source 01/22/23 1422 Oral     SpO2 01/22/23 1422 98 %     Weight --      Height --      Head Circumference --      Peak Flow --      Pain Score 01/22/23 1420 4     Pain Loc --  Pain Edu? --      Excl. in GC? --    No data found.  Updated Vital Signs BP 127/77 (BP Location: Right Arm)   Pulse 96   Temp 99 F (37.2 C) (Oral)   Resp 17   LMP 12/26/2022 (Exact Date)   SpO2 98%   Visual Acuity Right Eye Distance:   Left Eye Distance:   Bilateral Distance:    Right Eye Near:   Left Eye Near:    Bilateral Near:     Physical Exam Vitals and nursing note reviewed.  Constitutional:      General: She is not in acute distress.    Appearance: Normal appearance. She is not ill-appearing, toxic-appearing or diaphoretic.  HENT:     Head: Normocephalic and atraumatic.  Eyes:     Pupils: Pupils are equal, round, and reactive to light.  Neck:     Meningeal: Brudzinski's sign absent.     Comments: Equal strength upper extremities bilaterally 5 out of 5 Cardiovascular:     Rate and Rhythm: Normal rate.  Pulmonary:     Effort: Pulmonary effort is normal.  Musculoskeletal:      Cervical back: Normal range of motion and neck supple. No edema, erythema, signs of trauma, rigidity, torticollis or crepitus. Pain with movement and muscular tenderness present. No spinous process tenderness. Normal range of motion.  Skin:    General: Skin is warm and dry.  Neurological:     General: No focal deficit present.     Mental Status: She is alert and oriented to person, place, and time.  Psychiatric:        Mood and Affect: Mood normal.        Behavior: Behavior normal.      UC Treatments / Results  Labs (all labs ordered are listed, but only abnormal results are displayed) Labs Reviewed - No data to display Basic metabolic panel Order: [id number] Status: Final result     Visible to patient: No (inaccessible in MyChart)     Next appt: 02/26/2023 at 01:00 PM in [facility], NP)   0 Result Notes    Component Ref Range & Units 6 mo ago  Sodium 135 - 145 mmol/L 140  Potassium 3.5 - 5.1 mmol/L 3.5  Chloride 98 - 111 mmol/L 107  CO2 22 - 32 mmol/L 27  Glucose, Bld 70 - 99 mg/dL 88  Comment: Glucose reference range applies only to samples taken after fasting for at least 8 hours.  BUN 6 - 20 mg/dL 10  Creatinine, Ser 7.82 - 1.00 mg/dL 9.56  Calcium 8.9 - 21.3 mg/dL 8.9  GFR, Estimated >08 mL/min >60  Comment: (NOTE) Calculated using the CKD-EPI Creatinine Equation (2021)  Anion gap 5 - 15 6  Comment: Performed at [facility], [address], [place], [place] [address]  Resulting Agency [facility]         Specimen Collected: 07/07/22 15:41 Last Resulted: 07/07/22 17:21        EKG   Radiology No results found.  Procedures Procedures (including critical care time)  Medications Ordered in UC Medications  ketorolac (TORADOL) 30 MG/ML injection 30 mg (has no administration in time range)    Initial Impression / Assessment and Plan / UC Course  I have reviewed the triage vital signs and the nursing  notes.  Pertinent labs & imaging results that were available during my care of the patient were reviewed by me and considered in  my medical decision making (see chart for details).     Reviewed exam and symptoms with patient.  Discussed muscle spasm of neck contributing to her symptoms.  Patient given Toradol injection in clinic.  She was monitored for 10 minutes after injection with no reaction noted and tolerated well.  She was instructed no NSAIDs for 24 hours and she verbalized understanding Flexeril as needed.  Side effect profile reviewed Start naproxen twice daily tomorrow 5/23 Heat to the neck as needed Rest PCP follow-up if symptoms do not improve ER precautions reviewed and patient verbalized understanding Final Clinical Impressions(s) / UC Diagnoses   Final diagnoses:  Muscle spasms of neck   Discharge Instructions   None    ED Prescriptions     Medication Sig Dispense Auth. Provider   cyclobenzaprine (FLEXERIL) 10 MG tablet Take 1 tablet (10 mg total) by mouth 2 (two) times daily as needed for muscle spasms. 10 tablet Radford Pax, NP   naproxen (NAPROSYN) 500 MG tablet Take 1 tablet (500 mg total) by mouth 2 (two) times daily for 7 days. 14 tablet Radford Pax, NP      PDMP not reviewed this encounter.   Radford Pax, NP 01/22/23 [phone]

## 2023-01-22 NOTE — ED Triage Notes (Signed)
Pt presents with severe pain from the neck to the head. States it is hard to mover her neck and shoulders.  Home interventions; Excedrin and icy hot.

## 2023-01-22 NOTE — Discharge Instructions (Addendum)
You were given a Toradol injection in clinic today. Do not take any over the counter NSAID's such as Advil, ibuprofen, Aleve, or naproxen for 24 hours.  You may take tylenol if needed Flexeril as needed.  Please note this medication can make you drowsy.  Do not drink alcohol or drive while you are on this medication Start naproxen twice daily for 7 days tomorrow, 5/23.  Take this with food Heat to the neck as needed Rest Please follow-up with your PCP if your symptoms do not improve Please go to the ER if you have any worsening symptoms

## 2023-02-26 ENCOUNTER — Ambulatory Visit: Payer: Medicaid Other | Admitting: Nurse Practitioner

## 2023-03-03 ENCOUNTER — Encounter: Payer: Self-pay | Admitting: Internal Medicine

## 2023-03-03 ENCOUNTER — Ambulatory Visit (INDEPENDENT_AMBULATORY_CARE_PROVIDER_SITE_OTHER): Payer: Medicaid Other | Admitting: Internal Medicine

## 2023-03-03 VITALS — BP 120/80 | HR 89 | Temp 98.7°F | Ht 65.0 in | Wt 157.4 lb

## 2023-03-03 DIAGNOSIS — G8929 Other chronic pain: Secondary | ICD-10-CM

## 2023-03-03 DIAGNOSIS — Z720 Tobacco use: Secondary | ICD-10-CM

## 2023-03-03 DIAGNOSIS — H9203 Otalgia, bilateral: Secondary | ICD-10-CM | POA: Diagnosis not present

## 2023-03-03 NOTE — Progress Notes (Signed)
[facility] [address] [place] [address] Dept: [phone] Dept Fax: [phone]  New Patient Office Visit  Subjective:   Kylie Paul [birth year] 03/03/2023  Chief Complaint  Patient presents with   Establish Care    Wrist, shoulder and neck pain    HPI: Kylie Paul presents today to establish care at [facility]. Introduced to Publishing rights manager role and practice setting.  All questions answered.   Last annual physical: unknown  Concerns: See below   Discussed the use of AI scribe software for clinical note transcription with the patient, who gave verbal consent to proceed.  The patient presents to establish care and reports a history of chronic pain in both wrists and shoulders. The wrist pain has been present since high school and has been flaring up recently, causing discomfort in the fingers. The shoulder pain is a more recent development, starting after a physically demanding job. The patient also reports occasional knee pain, but it is not as bothersome as the wrist and shoulder pain.  In addition to the joint pain, the patient has been experiencing persistent pain at the back of both ears for the past three weeks. The pain is localized where the glasses sit, and adjusting the glasses has only provided minimal relief. The patient denies any hearing loss but reports occasional ringing in the ears.No fever or otorrhea.   The patient has a history of appendectomy and has been unemployed since contracting COVID-19. They report a family history of chronic pain, particularly in their mother. The patient also mentions a high level of recent stress due to their mother's hospitalization.  The patient uses a vape for stress relief and acknowledges a family history of addiction. They have not been taking any medication for their pain, but occasionally use Excedrin.        The following  portions of the patient's history were reviewed and updated as appropriate: past medical history, past surgical history, family history, social history, allergies, medications, and problem list.   Patient Active Problem List   Diagnosis Date Noted   Tobacco use 03/03/2023   Other chronic pain 03/03/2023   History reviewed. No pertinent past medical history. Past Surgical History:  Procedure Laterality Date   APPENDECTOMY     History reviewed. No pertinent family history. Outpatient Medications Prior to Visit  Medication Sig Dispense Refill   Multiple Vitamin (MULTIVITAMIN) capsule Take 1 capsule by mouth daily.     cyclobenzaprine (FLEXERIL) 10 MG tablet Take 1 tablet (10 mg total) by mouth 2 (two) times daily as needed for muscle spasms. (Patient not taking: Reported on 03/03/2023) 10 tablet 0   meclizine (ANTIVERT) 25 MG tablet Take 1 tablet (25 mg total) by mouth 3 (three) times daily as needed for dizziness. (Patient not taking: Reported on 03/03/2023) 30 tablet 0   ondansetron (ZOFRAN-ODT) 8 MG disintegrating tablet Take 1 tablet (8 mg total) by mouth every 8 (eight) hours as needed for nausea or vomiting. (Patient not taking: Reported on 03/03/2023) 20 tablet 0   fluconazole (DIFLUCAN) 150 MG tablet Take 1 tablet (150 mg total) by mouth every 3 (three) days. 5 tablet 0   metroNIDAZOLE (FLAGYL) 500 MG tablet Take 1 tablet (500 mg total) by mouth 2 (two) times daily. 14 tablet 0   No facility-administered medications prior to visit.   No Known Allergies  ROS: A complete ROS was performed with pertinent positives/negatives noted in the HPI. The remainder of  the ROS are negative.   Objective:   Today's Vitals   03/03/23 1346  BP: 120/80  Pulse: 89  Temp: 98.7 F (37.1 C)  TempSrc: Temporal  SpO2: 98%  Weight: 157 lb 6.4 oz (71.4 kg)  Height: 5\' 5"  (1.651 m)    GENERAL: Well-appearing, in NAD. Well nourished.  SKIN: Pink, warm and dry. No rash, lesion, ulceration, or  ecchymoses. HEENT:    HEAD: Normocephalic, non-traumatic.  EYES: Conjunctive pink without exudate. PERRL, EOMI.  EARS: External ear w/o redness, swelling, masses, or lesions. EAC clear. TM's intact, translucent w/o bulging, appropriate landmarks visualized. No mastoid tenderness.  NOSE: Septum midline w/o deformity. Nares patent, mucosa pink and non-inflamed w/o drainage. No sinus tenderness.  THROAT: Uvula midline. Oropharynx clear. Tonsils non-inflamed w/o exudate. Mucus membranes pink and moist.  NECK: Trachea midline. Full ROM w/o pain or tenderness. No lymphadenopathy.  RESPIRATORY: Chest wall symmetrical. Respirations even and non-labored. Breath sounds clear to auscultation bilaterally.  CARDIAC: S1, S2 present, regular rate and rhythm. Peripheral pulses 2+ bilaterally.  MSK: Muscle tone and strength appropriate for age. Joints w/o redness, or swelling. FROM, strong grip. Pain to paraspinal region of left upper back. EXTREMITIES: Without clubbing, cyanosis, or edema.  NEUROLOGIC: No motor or sensory deficits. Steady, even gait.  PSYCH/MENTAL STATUS: Alert, oriented x 3. Cooperative, appropriate mood and affect.   Health Maintenance Due  Topic Date Due   COVID-19 Vaccine (1) Never done   DTaP/Tdap/Td (6 - Tdap) 06/05/2001    No results found for any visits on 03/03/23.     Assessment & Plan:  Assessment and Plan    Chronic Pain (Shoulder, Wrist): Longstanding bilateral wrist pain, with recent increase in severity and radiation into fingers. Right shoulder pain for two years, with recent onset of left shoulder pain. No associated swelling or numbness. Pain exacerbated by physical work and stress. Family history of chronic pain. -Advise use of heat packs, ice packs, and over-the-counter analgesics such as ibuprofen and Tylenol as needed. -Consider rheumatoid arthritis screening blood work pending Human resources officer.  Ear Pain: Bilateral ear pain for three weeks,  located where glasses sit. No associated hearing loss, discharge, or infection. Some associated tinnitus. -Advise further adjustment of glasses and monitor for changes.  Tobacco Use: Current e-cigarette user, acknowledges potential health risks but uses for stress and mental health management. -Encourage consideration of cessation strategies.  General Health Maintenance / Followup Plans: -Schedule physical exam in four months.       Return in about 4 months (around 07/04/2023) for Annual physical exam .   Of note, portions of this note may have been created with voice recognition software Physicist, medical). While this note has been edited for accuracy, occasional wrong-word or 'sound-a-like' substitutions may have occurred due to the inherent limitations of voice recognition software.  Salvatore Decent, FNP

## 2023-03-03 NOTE — Patient Instructions (Signed)
Alternate tylenol and ibuprofen as needed for pain  Alternate ice and heat packs as needed for pain   Call medicaid to see if they will pay for lab work to screen for rheumatoid arthritis , joint inflammation markers

## 2023-03-18 ENCOUNTER — Telehealth: Payer: Self-pay

## 2023-03-18 NOTE — Telephone Encounter (Signed)
Chart review completed for patient. Patient is due for cervical cancer screen. Attempted to reach patient, no answer Kylie Paul [facility] Specialist

## 2023-05-17 DIAGNOSIS — O210 Mild hyperemesis gravidarum: Secondary | ICD-10-CM | POA: Diagnosis not present

## 2023-05-17 DIAGNOSIS — O26891 Other specified pregnancy related conditions, first trimester: Secondary | ICD-10-CM | POA: Diagnosis not present

## 2023-05-17 DIAGNOSIS — Z3A01 Less than 8 weeks gestation of pregnancy: Secondary | ICD-10-CM | POA: Diagnosis not present

## 2023-06-03 DIAGNOSIS — O99341 Other mental disorders complicating pregnancy, first trimester: Secondary | ICD-10-CM | POA: Diagnosis not present

## 2023-06-03 DIAGNOSIS — F99 Mental disorder, not otherwise specified: Secondary | ICD-10-CM | POA: Diagnosis not present

## 2023-06-03 DIAGNOSIS — O09291 Supervision of pregnancy with other poor reproductive or obstetric history, first trimester: Secondary | ICD-10-CM | POA: Diagnosis not present

## 2023-06-03 DIAGNOSIS — Z3A11 11 weeks gestation of pregnancy: Secondary | ICD-10-CM | POA: Diagnosis not present

## 2023-06-03 DIAGNOSIS — Z3689 Encounter for other specified antenatal screening: Secondary | ICD-10-CM | POA: Diagnosis not present

## 2023-06-03 DIAGNOSIS — O219 Vomiting of pregnancy, unspecified: Secondary | ICD-10-CM | POA: Diagnosis not present

## 2023-07-01 DIAGNOSIS — F99 Mental disorder, not otherwise specified: Secondary | ICD-10-CM | POA: Diagnosis not present

## 2023-07-01 DIAGNOSIS — Z3A15 15 weeks gestation of pregnancy: Secondary | ICD-10-CM | POA: Diagnosis not present

## 2023-07-01 DIAGNOSIS — O219 Vomiting of pregnancy, unspecified: Secondary | ICD-10-CM | POA: Diagnosis not present

## 2023-07-01 DIAGNOSIS — O99342 Other mental disorders complicating pregnancy, second trimester: Secondary | ICD-10-CM | POA: Diagnosis not present

## 2023-08-05 DIAGNOSIS — Z3482 Encounter for supervision of other normal pregnancy, second trimester: Secondary | ICD-10-CM | POA: Diagnosis not present

## 2023-08-05 DIAGNOSIS — Z363 Encounter for antenatal screening for malformations: Secondary | ICD-10-CM | POA: Diagnosis not present

## 2023-08-05 DIAGNOSIS — Z3A20 20 weeks gestation of pregnancy: Secondary | ICD-10-CM | POA: Diagnosis not present

## 2023-08-25 DIAGNOSIS — O26812 Pregnancy related exhaustion and fatigue, second trimester: Secondary | ICD-10-CM | POA: Diagnosis not present

## 2023-08-25 DIAGNOSIS — O368120 Decreased fetal movements, second trimester, not applicable or unspecified: Secondary | ICD-10-CM | POA: Diagnosis not present

## 2023-08-25 DIAGNOSIS — Z3A23 23 weeks gestation of pregnancy: Secondary | ICD-10-CM | POA: Diagnosis not present

## 2023-08-25 DIAGNOSIS — R42 Dizziness and giddiness: Secondary | ICD-10-CM | POA: Diagnosis not present

## 2023-09-02 DIAGNOSIS — Z8759 Personal history of other complications of pregnancy, childbirth and the puerperium: Secondary | ICD-10-CM | POA: Diagnosis not present

## 2023-09-02 DIAGNOSIS — Z3482 Encounter for supervision of other normal pregnancy, second trimester: Secondary | ICD-10-CM | POA: Diagnosis not present

## 2023-09-02 DIAGNOSIS — Z362 Encounter for other antenatal screening follow-up: Secondary | ICD-10-CM | POA: Diagnosis not present

## 2023-09-02 DIAGNOSIS — Z3A24 24 weeks gestation of pregnancy: Secondary | ICD-10-CM | POA: Diagnosis not present

## 2023-09-08 DIAGNOSIS — O98512 Other viral diseases complicating pregnancy, second trimester: Secondary | ICD-10-CM | POA: Diagnosis not present

## 2023-09-08 DIAGNOSIS — J101 Influenza due to other identified influenza virus with other respiratory manifestations: Secondary | ICD-10-CM | POA: Diagnosis not present

## 2023-09-08 DIAGNOSIS — Z3A25 25 weeks gestation of pregnancy: Secondary | ICD-10-CM | POA: Diagnosis not present

## 2023-09-30 DIAGNOSIS — O09293 Supervision of pregnancy with other poor reproductive or obstetric history, third trimester: Secondary | ICD-10-CM | POA: Diagnosis not present

## 2023-09-30 DIAGNOSIS — Z3A28 28 weeks gestation of pregnancy: Secondary | ICD-10-CM | POA: Diagnosis not present

## 2023-09-30 DIAGNOSIS — F99 Mental disorder, not otherwise specified: Secondary | ICD-10-CM | POA: Diagnosis not present

## 2023-09-30 DIAGNOSIS — O99343 Other mental disorders complicating pregnancy, third trimester: Secondary | ICD-10-CM | POA: Diagnosis not present

## 2023-10-01 DIAGNOSIS — M549 Dorsalgia, unspecified: Secondary | ICD-10-CM | POA: Diagnosis not present

## 2023-10-01 DIAGNOSIS — O26893 Other specified pregnancy related conditions, third trimester: Secondary | ICD-10-CM | POA: Diagnosis not present

## 2023-10-01 DIAGNOSIS — R109 Unspecified abdominal pain: Secondary | ICD-10-CM | POA: Diagnosis not present

## 2023-10-01 DIAGNOSIS — Z3A28 28 weeks gestation of pregnancy: Secondary | ICD-10-CM | POA: Diagnosis not present

## 2023-10-28 DIAGNOSIS — F32A Depression, unspecified: Secondary | ICD-10-CM | POA: Diagnosis not present

## 2023-10-28 DIAGNOSIS — O99343 Other mental disorders complicating pregnancy, third trimester: Secondary | ICD-10-CM | POA: Diagnosis not present

## 2023-10-28 DIAGNOSIS — F419 Anxiety disorder, unspecified: Secondary | ICD-10-CM | POA: Diagnosis not present

## 2023-10-28 DIAGNOSIS — O09293 Supervision of pregnancy with other poor reproductive or obstetric history, third trimester: Secondary | ICD-10-CM | POA: Diagnosis not present

## 2023-10-28 DIAGNOSIS — Z3A32 32 weeks gestation of pregnancy: Secondary | ICD-10-CM | POA: Diagnosis not present

## 2023-11-11 DIAGNOSIS — Z362 Encounter for other antenatal screening follow-up: Secondary | ICD-10-CM | POA: Diagnosis not present

## 2023-11-11 DIAGNOSIS — O09293 Supervision of pregnancy with other poor reproductive or obstetric history, third trimester: Secondary | ICD-10-CM | POA: Diagnosis not present

## 2023-11-11 DIAGNOSIS — Z3A34 34 weeks gestation of pregnancy: Secondary | ICD-10-CM | POA: Diagnosis not present

## 2023-11-11 DIAGNOSIS — F99 Mental disorder, not otherwise specified: Secondary | ICD-10-CM | POA: Diagnosis not present

## 2023-11-11 DIAGNOSIS — O99343 Other mental disorders complicating pregnancy, third trimester: Secondary | ICD-10-CM | POA: Diagnosis not present

## 2023-11-11 DIAGNOSIS — Z8759 Personal history of other complications of pregnancy, childbirth and the puerperium: Secondary | ICD-10-CM | POA: Diagnosis not present

## 2023-11-15 DIAGNOSIS — M549 Dorsalgia, unspecified: Secondary | ICD-10-CM | POA: Diagnosis not present

## 2023-11-15 DIAGNOSIS — R52 Pain, unspecified: Secondary | ICD-10-CM | POA: Diagnosis not present

## 2023-11-15 DIAGNOSIS — R109 Unspecified abdominal pain: Secondary | ICD-10-CM | POA: Diagnosis not present

## 2023-11-15 DIAGNOSIS — R079 Chest pain, unspecified: Secondary | ICD-10-CM | POA: Diagnosis not present

## 2023-11-15 DIAGNOSIS — O26893 Other specified pregnancy related conditions, third trimester: Secondary | ICD-10-CM | POA: Diagnosis not present

## 2023-11-15 DIAGNOSIS — Z3A34 34 weeks gestation of pregnancy: Secondary | ICD-10-CM | POA: Diagnosis not present

## 2023-11-16 DIAGNOSIS — I491 Atrial premature depolarization: Secondary | ICD-10-CM | POA: Diagnosis not present

## 2023-11-25 DIAGNOSIS — Z3483 Encounter for supervision of other normal pregnancy, third trimester: Secondary | ICD-10-CM | POA: Diagnosis not present

## 2023-11-25 DIAGNOSIS — Z3A36 36 weeks gestation of pregnancy: Secondary | ICD-10-CM | POA: Diagnosis not present

## 2023-12-09 DIAGNOSIS — Z3A38 38 weeks gestation of pregnancy: Secondary | ICD-10-CM | POA: Diagnosis not present

## 2023-12-09 DIAGNOSIS — O99820 Streptococcus B carrier state complicating pregnancy: Secondary | ICD-10-CM | POA: Diagnosis not present

## 2023-12-11 DIAGNOSIS — O471 False labor at or after 37 completed weeks of gestation: Secondary | ICD-10-CM | POA: Diagnosis not present

## 2023-12-11 DIAGNOSIS — Z3A38 38 weeks gestation of pregnancy: Secondary | ICD-10-CM | POA: Diagnosis not present

## 2023-12-13 DIAGNOSIS — Z3A38 38 weeks gestation of pregnancy: Secondary | ICD-10-CM | POA: Diagnosis not present

## 2023-12-13 DIAGNOSIS — O471 False labor at or after 37 completed weeks of gestation: Secondary | ICD-10-CM | POA: Diagnosis not present

## 2023-12-15 DIAGNOSIS — O99344 Other mental disorders complicating childbirth: Secondary | ICD-10-CM | POA: Diagnosis not present

## 2023-12-15 DIAGNOSIS — Z3A39 39 weeks gestation of pregnancy: Secondary | ICD-10-CM | POA: Diagnosis not present

## 2023-12-15 DIAGNOSIS — F429 Obsessive-compulsive disorder, unspecified: Secondary | ICD-10-CM | POA: Diagnosis not present

## 2023-12-15 DIAGNOSIS — O99824 Streptococcus B carrier state complicating childbirth: Secondary | ICD-10-CM | POA: Diagnosis not present

## 2023-12-15 DIAGNOSIS — Z28310 Unvaccinated for covid-19: Secondary | ICD-10-CM | POA: Diagnosis not present

## 2023-12-15 DIAGNOSIS — F988 Other specified behavioral and emotional disorders with onset usually occurring in childhood and adolescence: Secondary | ICD-10-CM | POA: Diagnosis not present

## 2023-12-15 DIAGNOSIS — Z8759 Personal history of other complications of pregnancy, childbirth and the puerperium: Secondary | ICD-10-CM | POA: Diagnosis not present

## 2023-12-15 DIAGNOSIS — Z9049 Acquired absence of other specified parts of digestive tract: Secondary | ICD-10-CM | POA: Diagnosis not present

## 2023-12-15 DIAGNOSIS — Z8659 Personal history of other mental and behavioral disorders: Secondary | ICD-10-CM | POA: Diagnosis not present

## 2024-01-05 DIAGNOSIS — F53 Postpartum depression: Secondary | ICD-10-CM | POA: Diagnosis not present
